# Patient Record
Sex: Male | Born: 1965 | Race: White | Hispanic: No | Marital: Single | Smoking: Current every day smoker
Health system: Southern US, Community
[De-identification: ages and names within clinical notes are randomized; demographics above are authoritative.]

---

## 1998-03-24 ENCOUNTER — Emergency Department (HOSPITAL_COMMUNITY): Admission: EM | Admit: 1998-03-24 | Discharge: 1998-03-24 | Payer: Self-pay | Admitting: Emergency Medicine

## 1998-03-24 ENCOUNTER — Encounter: Payer: Self-pay | Admitting: Emergency Medicine

## 1998-09-03 ENCOUNTER — Emergency Department (HOSPITAL_COMMUNITY): Admission: EM | Admit: 1998-09-03 | Discharge: 1998-09-03 | Payer: Self-pay | Admitting: Emergency Medicine

## 1998-09-03 ENCOUNTER — Encounter: Payer: Self-pay | Admitting: Emergency Medicine

## 2009-01-21 ENCOUNTER — Emergency Department (HOSPITAL_COMMUNITY): Admission: EM | Admit: 2009-01-21 | Discharge: 2009-01-21 | Payer: Self-pay | Admitting: Emergency Medicine

## 2009-10-06 ENCOUNTER — Emergency Department (HOSPITAL_COMMUNITY): Admission: EM | Admit: 2009-10-06 | Discharge: 2009-10-06 | Payer: Self-pay | Admitting: Emergency Medicine

## 2010-09-05 ENCOUNTER — Emergency Department (HOSPITAL_COMMUNITY)
Admission: EM | Admit: 2010-09-05 | Discharge: 2010-09-05 | Disposition: A | Discharge status: Home / Self Care | Payer: Self-pay | Attending: Emergency Medicine | Admitting: Emergency Medicine

## 2010-09-05 DIAGNOSIS — M25559 Pain in unspecified hip: Secondary | ICD-10-CM | POA: Insufficient documentation

## 2010-09-05 DIAGNOSIS — M1610 Unilateral primary osteoarthritis, unspecified hip: Secondary | ICD-10-CM | POA: Insufficient documentation

## 2010-09-05 DIAGNOSIS — M169 Osteoarthritis of hip, unspecified: Secondary | ICD-10-CM | POA: Insufficient documentation

## 2010-09-05 DIAGNOSIS — G8929 Other chronic pain: Secondary | ICD-10-CM | POA: Insufficient documentation

## 2010-11-13 ENCOUNTER — Emergency Department (HOSPITAL_COMMUNITY)
Admission: EM | Admit: 2010-11-13 | Discharge: 2010-11-13 | Disposition: A | Discharge status: Home / Self Care | Payer: Self-pay | Attending: Emergency Medicine | Admitting: Emergency Medicine

## 2010-11-13 DIAGNOSIS — M129 Arthropathy, unspecified: Secondary | ICD-10-CM | POA: Insufficient documentation

## 2010-11-13 DIAGNOSIS — M25559 Pain in unspecified hip: Secondary | ICD-10-CM | POA: Insufficient documentation

## 2013-04-04 HISTORY — PX: HIP SURGERY: SHX245

## 2016-12-26 ENCOUNTER — Emergency Department (HOSPITAL_COMMUNITY): Payer: Self-pay

## 2016-12-26 ENCOUNTER — Emergency Department (HOSPITAL_COMMUNITY)
Admission: EM | Admit: 2016-12-26 | Discharge: 2016-12-26 | Disposition: A | Discharge status: Home / Self Care | Payer: Self-pay | Attending: Emergency Medicine | Admitting: Emergency Medicine

## 2016-12-26 ENCOUNTER — Encounter (HOSPITAL_COMMUNITY): Payer: Self-pay

## 2016-12-26 DIAGNOSIS — Z96641 Presence of right artificial hip joint: Secondary | ICD-10-CM | POA: Insufficient documentation

## 2016-12-26 DIAGNOSIS — M545 Low back pain, unspecified: Secondary | ICD-10-CM

## 2016-12-26 DIAGNOSIS — F17210 Nicotine dependence, cigarettes, uncomplicated: Secondary | ICD-10-CM | POA: Insufficient documentation

## 2016-12-26 DIAGNOSIS — M5126 Other intervertebral disc displacement, lumbar region: Secondary | ICD-10-CM

## 2016-12-26 LAB — COMPREHENSIVE METABOLIC PANEL
ALBUMIN: 4 g/dL (ref 3.5–5.0)
ALK PHOS: 87 U/L (ref 38–126)
ALT: 13 U/L — AB (ref 17–63)
ANION GAP: 7 (ref 5–15)
AST: 25 U/L (ref 15–41)
BILIRUBIN TOTAL: 0.5 mg/dL (ref 0.3–1.2)
BUN: 14 mg/dL (ref 6–20)
CALCIUM: 9.6 mg/dL (ref 8.9–10.3)
CO2: 24 mmol/L (ref 22–32)
CREATININE: 0.99 mg/dL (ref 0.61–1.24)
Chloride: 107 mmol/L (ref 101–111)
GFR calc Af Amer: >60 mL/min (ref >60)
GFR calc non Af Amer: >60 mL/min (ref >60)
GLUCOSE: 103 mg/dL — AB (ref 65–99)
Potassium: 4.1 mmol/L (ref 3.5–5.1)
SODIUM: 138 mmol/L (ref 135–145)
TOTAL PROTEIN: 6.6 g/dL (ref 6.5–8.1)

## 2016-12-26 LAB — CBC WITH DIFFERENTIAL/PLATELET
BASOS PCT: 1 %
Basophils Absolute: 0.1 10*3/uL (ref 0.0–0.1)
EOS ABS: 0.4 10*3/uL (ref 0.0–0.7)
Eosinophils Relative: 4 %
HEMATOCRIT: 42.1 % (ref 39.0–52.0)
HEMOGLOBIN: 14.8 g/dL (ref 13.0–17.0)
LYMPHS ABS: 3.4 10*3/uL (ref 0.7–4.0)
Lymphocytes Relative: 41 %
MCH: 32.5 pg (ref 26.0–34.0)
MCHC: 35.2 g/dL (ref 30.0–36.0)
MCV: 92.3 fL (ref 78.0–100.0)
MONO ABS: 0.8 10*3/uL (ref 0.1–1.0)
MONOS PCT: 9 %
NEUTROS ABS: 3.8 10*3/uL (ref 1.7–7.7)
Neutrophils Relative %: 45 %
Platelets: 252 10*3/uL (ref 150–400)
RBC: 4.56 MIL/uL (ref 4.22–5.81)
RDW: 13.8 % (ref 11.5–15.5)
WBC: 8.4 10*3/uL (ref 4.0–10.5)

## 2016-12-26 LAB — URINALYSIS, ROUTINE W REFLEX MICROSCOPIC
Bilirubin Urine: NEGATIVE
Glucose, UA: NEGATIVE mg/dL
Hgb urine dipstick: NEGATIVE
Ketones, ur: NEGATIVE mg/dL
Leukocytes, UA: NEGATIVE
Nitrite: NEGATIVE
Protein, ur: NEGATIVE mg/dL
Specific Gravity, Urine: 1.01 (ref 1.005–1.030)
pH: 6 (ref 5.0–8.0)

## 2016-12-26 MED ORDER — HYDROCODONE-ACETAMINOPHEN 5-325 MG PO TABS: 1.0000 | ORAL_TABLET | ORAL | 0 refills | Status: DC | PRN

## 2016-12-26 MED ORDER — ONDANSETRON HCL 4 MG/2ML IJ SOLN
4.0000 mg | Freq: Once | INTRAMUSCULAR | Status: AC
  Administered 2016-12-26: 4 mg via INTRAVENOUS
  Filled 2016-12-26: qty 2

## 2016-12-26 MED ORDER — MORPHINE SULFATE (PF) 4 MG/ML IV SOLN
4.0000 mg | Freq: Once | INTRAVENOUS | Status: AC
  Administered 2016-12-26: 4 mg via INTRAVENOUS
  Filled 2016-12-26: qty 1

## 2016-12-26 MED ORDER — LIDOCAINE 5 % EX PTCH: 1.0000 | MEDICATED_PATCH | CUTANEOUS | 0 refills | Status: DC

## 2016-12-26 MED ORDER — METHOCARBAMOL 500 MG PO TABS: 500.0000 mg | ORAL_TABLET | Freq: Two times a day (BID) | ORAL | 0 refills | Status: DC

## 2016-12-26 MED ORDER — KETOROLAC TROMETHAMINE 30 MG/ML IJ SOLN
30.0000 mg | Freq: Once | INTRAMUSCULAR | Status: AC
  Administered 2016-12-26: 30 mg via INTRAVENOUS
  Filled 2016-12-26: qty 1

## 2016-12-26 MED ORDER — IBUPROFEN 600 MG PO TABS: 600.0000 mg | ORAL_TABLET | Freq: Four times a day (QID) | ORAL | 0 refills | Status: DC | PRN

## 2016-12-26 MED ORDER — DEXAMETHASONE SODIUM PHOSPHATE 10 MG/ML IJ SOLN
10.0000 mg | Freq: Once | INTRAMUSCULAR | Status: AC
  Administered 2016-12-26: 10 mg via INTRAVENOUS
  Filled 2016-12-26: qty 1

## 2016-12-26 NOTE — ED Provider Notes (Signed)
[facility]-EMERGENCY DEPT Provider Note   CSN: [id number] Arrival date & time: 12/26/16  1305     History   Chief Complaint Chief Complaint  Patient presents with  . Back Pain    HPI Elijah Lester is a 51 y.o. male.  HPI   Elijah Lester is a 51 y.o. male, with a history of Scoliosis and right total hip replacement, presenting to the ED with Lower back pain beginning yesterday. Pain arose at the end of the day. Pain is severe, constant, 10/10, shooting, radiating around to the front of the hips and down the front of the legs bilaterally. Patient also endorses weakness at the hips as well as numbness in the bilateral legs. He has been taking ibuprofen without improvement. Patient has chronic back pain due to scoliosis, however, has never had pain like this and has never had weakness or numbness.  Patient also complains of lymphadenopathy noted under the right arm and on the right side of the neck for the last 6 months. Areas are tender to the touch.  Denies fever/chills, N/V/D, changes in bowel or bladder function, abdominal pain, falls/trauma, or any other complaints.    History reviewed. No pertinent past medical history.  There are no active problems to display for this patient.   Past Surgical History:  Procedure Laterality Date  . HIP SURGERY Right 2015       Home Medications    Prior to Admission medications   Medication Sig Start Date End Date Taking? Authorizing Provider  ibuprofen (ADVIL,MOTRIN) 800 MG tablet Take 800 mg by mouth every 8 (eight) hours as needed (for hip pain).    Yes [provider]  naproxen sodium (ANAPROX) 220 MG tablet Take 220-440 mg by mouth 2 (two) times daily as needed (for hip pain).    Yes [provider]  HYDROcodone-acetaminophen (NORCO/VICODIN) 5-325 MG tablet Take 1-2 tablets by mouth every 4 (four) hours as needed for severe pain. 12/26/16   Dynesha Woolen C, PA-C  ibuprofen (ADVIL,MOTRIN) 600 MG tablet Take 1 tablet (600  mg total) by mouth every 6 (six) hours as needed. 12/26/16   Jemar Paulsen C, PA-C  lidocaine (LIDODERM) 5 % Place 1 patch onto the skin daily. Remove & Discard patch within 12 hours or as directed by MD 12/26/16   Venna Berberich C, PA-C  methocarbamol (ROBAXIN) 500 MG tablet Take 1 tablet (500 mg total) by mouth 2 (two) times daily. 12/26/16   Tabatha Razzano, Hillard Danker, PA-C    Family History No family history on file.  Social History Social History  Substance Use Topics  . Smoking status: Current Every Day Smoker    Types: Cigarettes  . Smokeless tobacco: Never Used  . Alcohol use No     Allergies   Patient has no known allergies.   Review of Systems Review of Systems  Constitutional: Negative for chills, diaphoresis and fever.  Respiratory: Negative for shortness of breath.   Cardiovascular: Negative for chest pain.  Gastrointestinal: Negative for abdominal pain, constipation, diarrhea, nausea and vomiting.  Genitourinary: Negative for difficulty urinating.  Musculoskeletal: Positive for back pain.  Neurological: Positive for weakness and numbness. Negative for dizziness, light-headedness and headaches.  Hematological: Positive for adenopathy.  All other systems reviewed and are negative.    Physical Exam Updated Vital Signs BP (!) 148/89 (BP Location: Left Arm)   Pulse 60   Temp 98.9 F (37.2 C) (Oral)   Resp 14   Ht  (1.803 m)  Wt 68 kg (150 lb)   SpO2 99%   BMI 20.92 kg/m   Physical Exam  Constitutional: He appears well-developed and well-nourished. No distress.  HENT:  Head: Normocephalic and atraumatic.  Eyes: Conjunctivae are normal.  Neck: Neck supple.  Cardiovascular: Normal rate, regular rhythm, normal heart sounds and intact distal pulses.   Pulmonary/Chest: Effort normal and breath sounds normal. No respiratory distress.  Abdominal: Soft. There is no tenderness. There is no guarding.  Genitourinary:  Genitourinary Comments: Rectal tone appears to be normal.  Perianal sensation intact. RN served as Biomedical engineer.  Musculoskeletal: He exhibits no edema.       Lumbar back: He exhibits tenderness.       Back:  Full passive range of motion in the major joints of the lower extremities bilaterally.  Lymphadenopathy:       Head (right side): Tonsillar adenopathy present.    He has no cervical adenopathy.    He has axillary adenopathy.       Right axillary: Lateral adenopathy present.  Neurological: He is alert.  No noted sensory deficits. No noted saddle anesthesias. 2/5 strength with hip flexion bilaterally. 4/5 strength with flexion and extension at the knees bilaterally. 5/5 bilateral plantar and dorsiflexion. Normal patellar and Achilles reflexes.   Skin: Skin is warm and dry. He is not diaphoretic.  Psychiatric: He has a normal mood and affect. His behavior is normal.  Nursing note and vitals reviewed.    ED Treatments / Results  Labs (all labs ordered are listed, but only abnormal results are displayed) Labs Reviewed  URINALYSIS, ROUTINE W REFLEX MICROSCOPIC - Abnormal; Notable for the following:       Result Value   Color, Urine STRAW (*)    All other components within normal limits  COMPREHENSIVE METABOLIC PANEL - Abnormal; Notable for the following:    Glucose, Bld 103 (*)    ALT 13 (*)    All other components within normal limits  CBC WITH DIFFERENTIAL/PLATELET    EKG  EKG Interpretation None       Radiology Mr Lumbar Spine Wo Contrast  Result Date: 12/26/2016 CLINICAL DATA:  Initial evaluation for increase low back pain with intermittent leg numbness. EXAM: MRI LUMBAR SPINE WITHOUT CONTRAST TECHNIQUE: Multiplanar, multisequence MR imaging of the lumbar spine was performed. No intravenous contrast was administered. COMPARISON:  None. FINDINGS: Segmentation: Normal segmentation. Lowest well-formed disc labeled the L5-S1 level. Alignment: Straightening with slight reversal of the normal lumbar lordosis. Trace retrolisthesis of  L2 on L3, L3 on L4, and L4 on L5. Mild dextroscoliosis. Vertebrae: Vertebral body heights maintained. No evidence for acute or chronic fracture. Prominent reactive endplate changes present about the L2-3 interspace. More mild reactive endplate changes noted at L3-4 and L5-S1 as well. No discrete or worrisome osseous lesions. Conus medullaris: Extends to the L1-2 level and appears normal. Paraspinal and other soft tissues: Paraspinous soft tissues within normal limits. Visualized visceral structures are unremarkable. Disc levels: L1-2: Mild degenerative disc bulge flattens the ventral thecal sac. No significant stenosis. L2-3: Trace retrolisthesis. Diffuse disc bulge with degenerative intervertebral disc space narrowing and disc desiccation. Mild facet and ligamentum flavum hypertrophy. No significant canal stenosis. Mild left L2 foraminal narrowing. L3-4: Trace retrolisthesis. Diffuse disc bulge with disc desiccation and intervertebral disc space narrowing. Disc bulging eccentric to the left, closely approximating the exiting left L3 nerve root without frank impingement (series 9, image 22). Mild facet and ligamentum flavum hypertrophy. No significant canal stenosis. Mild-to-moderate left with mild  right foraminal stenosis. L4-5: Diffuse disc bulge with disc desiccation and intervertebral disc space narrowing. Superimposed 6 mm central disc protrusion indents the ventral thecal sac. Protruding disc closely approximates the descending L5 nerve roots without frank impingement. Mild facet and ligamentum flavum hypertrophy. Resultant mild spinal stenosis. Mild to moderate bilateral L4 foraminal stenosis. L5-S1: Diffuse disc bulge with disc desiccation and intervertebral disc space narrowing. Mild reactive endplate changes. Superimposed broad central/ right subarticular disc protrusion (series 9, image 35). Protruding disc encroaches upon the right lateral recess, abutting the descending right S1 nerve root. Mild facet  hypertrophy. No significant spinal stenosis. Mild to moderate bilateral L5 foraminal stenosis, right worse than left. IMPRESSION: 1. No acute abnormality within the lumbar spine. 2. Central/right subarticular disc protrusion at L5-S1, contacting the descending right S1 nerve root in the right lateral recess. 3. Central disc protrusion at L4-5 with resultant mild canal stenosis without frank neural impingement. 4. Multifactorial degenerative changes with resultant mild to moderate foraminal narrowing on the left at L3-4, and bilaterally at L4-5 and L5-S1. Electronically Signed   By: Rise Mu M.D.   On: 12/26/2016 22:31    Procedures Procedures (including critical care time)  Medications Ordered in ED Medications  dexamethasone (DECADRON) injection 10 mg (not administered)  morphine 4 MG/ML injection 4 mg (not administered)  ketorolac (TORADOL) 30 MG/ML injection 30 mg (30 mg Intravenous Given 12/26/16 2037)  morphine 4 MG/ML injection 4 mg (4 mg Intravenous Given 12/26/16 2129)  ondansetron (ZOFRAN) injection 4 mg (4 mg Intravenous Given 12/26/16 2130)     Initial Impression / Assessment and Plan / ED Course  I have reviewed the triage vital signs and the nursing notes.  Pertinent labs & imaging results that were available during my care of the patient were reviewed by me and considered in my medical decision making (see chart for details).  Clinical Course as of Dec 26 2309  Mon Dec 26, 2016  2110 Patient states his pain is now about 8/10, slightly improved with the Toradol.  [SJ]  2238 Pain is 5/10. Patient's strength retested. 4/5 with flexion at the hips bilaterally. 5/5 in the knees and ankles.   [SJ]  2250 Discussed lab and imaging results with the patient as well as follow-up plan. Patient ambulated without assistance, but with antalgic gait.  [SJ]    Clinical Course User Index [SJ] Lowen Barringer C, PA-C    Patient presents with acute back pain. Weakness on initial exam.  MRI obtained. Central disc protrusion without neural impingement. Weakness improved with pain management. Patient ambulated without assistance. Neurosurgery follow-up. The patient was given instructions for home care as well as return precautions. Patient voices understanding of these instructions, accepts the plan, and is comfortable with discharge.   Findings and plan of care discussed with Benjiman Core, MD.    Final Clinical Impressions(s) / ED Diagnoses   Final diagnoses:  Acute midline low back pain without sciatica  Protrusion of lumbar intervertebral disc    New Prescriptions New Prescriptions   HYDROCODONE-ACETAMINOPHEN (NORCO/VICODIN) 5-325 MG TABLET    Take 1-2 tablets by mouth every 4 (four) hours as needed for severe pain.   IBUPROFEN (ADVIL,MOTRIN) 600 MG TABLET    Take 1 tablet (600 mg total) by mouth every 6 (six) hours as needed.   LIDOCAINE (LIDODERM) 5 %    Place 1 patch onto the skin daily. Remove & Discard patch within 12 hours or as directed by MD   METHOCARBAMOL (ROBAXIN) 500 MG  TABLET    Take 1 tablet (500 mg total) by mouth 2 (two) times daily.  Concepcion Living, PA-C 12/26/16 2311    Benjiman Core, MD 12/27/16 0010

## 2016-12-26 NOTE — ED Triage Notes (Signed)
Pt. Has a hx. Of lower back pain, denies any injury.  This is a pain that he has had, but not followed with a MD.  In the last few days the pain has increased and he is having numbness to his legs and his intermittent numbness to his rt. Arm and shoulder.  He denies any  Urinary or bowel problems.  He does report that he has swollen lymph nodes to bother underarms and bilateral neck. Painful to touch on assessment.  denies any sore throat no bleeding noted.  Denies any fevers.  Skin is pink warm and dry.  Alert and oriented X 4.  Pt. Is having difficulty sitting must stand for relief

## 2016-12-26 NOTE — Discharge Instructions (Signed)
There were noted disc protrusions on MRI. This is likely causing her pain. Expect your soreness to increase over the next 2-3 days. Take it easy, but do not lay around too much as this may make any stiffness worse.  Antiinflammatory medications: Take 600 mg of ibuprofen every 6 hours or 440 mg (over the counter dose) to 500 mg (prescription dose) of naproxen every 12 hours for the next 3 days. After this time, these medications may be used as needed for pain. Take these medications with food to avoid upset stomach. Choose only one of these medications, do not take them together.  Tylenol: Should you continue to have additional pain while taking the ibuprofen or naproxen, you may add in tylenol as needed. Your daily total maximum amount of tylenol from all sources should be limited to /day for persons without liver problems, or /day for those with liver problems. Vicodin: May take the Vicodin as needed for severe pain. Do not drive or perform other dangerous activities while taking the Vicodin. Muscle relaxer: Robaxin is a muscle relaxer and may help loosen stiff and/or spasming muscles. Do not take the Robaxin while driving or performing other dangerous activities.  Lidocaine patches: These are available via either prescription or over-the-counter. The over-the-counter option may be more economical one and are likely just as effective. There are multiple over-the-counter brands, such as Salonpas. Exercises: Be sure to perform the attached exercises starting with three times a week and working up to performing them daily. This is an essential part of preventing [place] term problems.   Follow up with the neurosurgeon for further assessment and management of this issue.   There were no acute abnormalities noted on the lab work. Follow-up with a primary care provider for continued assessment for the lymph node abnormalities.

## 2016-12-26 NOTE — ED Notes (Signed)
Patient transported to MRI 

## 2019-03-12 ENCOUNTER — Other Ambulatory Visit: Payer: Self-pay

## 2019-03-12 ENCOUNTER — Encounter (HOSPITAL_COMMUNITY): Payer: Self-pay | Admitting: Emergency Medicine

## 2019-03-12 ENCOUNTER — Emergency Department (HOSPITAL_COMMUNITY): Payer: Self-pay

## 2019-03-12 ENCOUNTER — Inpatient Hospital Stay (HOSPITAL_COMMUNITY)
Admission: EM | Discharge status: Against Medical Advice | Payer: Self-pay | Source: Home / Self Care | Attending: Cardiology

## 2019-03-12 ENCOUNTER — Inpatient Hospital Stay (HOSPITAL_COMMUNITY)
Admission: EM | Admit: 2019-03-12 | Discharge: 2019-03-12 | DRG: 287 | Discharge status: Against Medical Advice | Payer: Self-pay | Attending: Cardiology | Admitting: Cardiology

## 2019-03-12 DIAGNOSIS — Z8249 Family history of ischemic heart disease and other diseases of the circulatory system: Secondary | ICD-10-CM

## 2019-03-12 DIAGNOSIS — Z79891 Long term (current) use of opiate analgesic: Secondary | ICD-10-CM

## 2019-03-12 DIAGNOSIS — Z5329 Procedure and treatment not carried out because of patient's decision for other reasons: Secondary | ICD-10-CM | POA: Diagnosis not present

## 2019-03-12 DIAGNOSIS — Z20828 Contact with and (suspected) exposure to other viral communicable diseases: Secondary | ICD-10-CM | POA: Diagnosis present

## 2019-03-12 DIAGNOSIS — Z720 Tobacco use: Secondary | ICD-10-CM | POA: Diagnosis present

## 2019-03-12 DIAGNOSIS — I309 Acute pericarditis, unspecified: Principal | ICD-10-CM | POA: Diagnosis present

## 2019-03-12 DIAGNOSIS — I25110 Atherosclerotic heart disease of native coronary artery with unstable angina pectoris: Secondary | ICD-10-CM | POA: Diagnosis present

## 2019-03-12 DIAGNOSIS — I200 Unstable angina: Secondary | ICD-10-CM | POA: Diagnosis present

## 2019-03-12 DIAGNOSIS — Z79899 Other long term (current) drug therapy: Secondary | ICD-10-CM

## 2019-03-12 DIAGNOSIS — I213 ST elevation (STEMI) myocardial infarction of unspecified site: Secondary | ICD-10-CM

## 2019-03-12 DIAGNOSIS — Z791 Long term (current) use of non-steroidal anti-inflammatories (NSAID): Secondary | ICD-10-CM

## 2019-03-12 DIAGNOSIS — R072 Precordial pain: Secondary | ICD-10-CM

## 2019-03-12 DIAGNOSIS — F17210 Nicotine dependence, cigarettes, uncomplicated: Secondary | ICD-10-CM | POA: Diagnosis present

## 2019-03-12 HISTORY — PX: LEFT HEART CATH AND CORONARY ANGIOGRAPHY: CATH118249

## 2019-03-12 LAB — BASIC METABOLIC PANEL
Anion gap: 11 (ref 5–15)
BUN: 10 mg/dL (ref 6–20)
CO2: 20 mmol/L — ABNORMAL LOW (ref 22–32)
Calcium: 9.6 mg/dL (ref 8.9–10.3)
Chloride: 104 mmol/L (ref 98–111)
Creatinine, Ser: 1.04 mg/dL (ref 0.61–1.24)
GFR calc Af Amer: >60 mL/min (ref >60)
GFR calc non Af Amer: >60 mL/min (ref >60)
Glucose, Bld: 84 mg/dL (ref 70–99)
Potassium: 4.3 mmol/L (ref 3.5–5.1)
Sodium: 135 mmol/L (ref 135–145)

## 2019-03-12 LAB — LIPID PANEL
Cholesterol: 219 mg/dL — ABNORMAL HIGH (ref 0–200)
HDL: 54 mg/dL (ref >40)
LDL Cholesterol: 150 mg/dL — ABNORMAL HIGH (ref 0–99)
Total CHOL/HDL Ratio: 4.1 RATIO
Triglycerides: 73 mg/dL (ref <150)
VLDL: 15 mg/dL (ref 0–40)

## 2019-03-12 LAB — TROPONIN I (HIGH SENSITIVITY)
Troponin I (High Sensitivity): 10 ng/L (ref <18)
Troponin I (High Sensitivity): 7 ng/L (ref <18)

## 2019-03-12 LAB — POCT I-STAT, CHEM 8
BUN: 10 mg/dL (ref 6–20)
Calcium, Ion: 1.22 mmol/L (ref 1.15–1.40)
Chloride: 104 mmol/L (ref 98–111)
Creatinine, Ser: 0.8 mg/dL (ref 0.61–1.24)
Glucose, Bld: 87 mg/dL (ref 70–99)
HCT: 44 % (ref 39.0–52.0)
Hemoglobin: 15 g/dL (ref 13.0–17.0)
Potassium: 4.1 mmol/L (ref 3.5–5.1)
Sodium: 138 mmol/L (ref 135–145)
TCO2: 23 mmol/L (ref 22–32)

## 2019-03-12 LAB — RESPIRATORY PANEL BY RT PCR (FLU A&B, COVID)
Influenza A by PCR: NEGATIVE
Influenza B by PCR: NEGATIVE
SARS Coronavirus 2 by RT PCR: NEGATIVE

## 2019-03-12 LAB — CBC
HCT: 46.7 % (ref 39.0–52.0)
Hemoglobin: 15.6 g/dL (ref 13.0–17.0)
MCH: 32 pg (ref 26.0–34.0)
MCHC: 33.4 g/dL (ref 30.0–36.0)
MCV: 95.9 fL (ref 80.0–100.0)
Platelets: 310 10*3/uL (ref 150–400)
RBC: 4.87 MIL/uL (ref 4.22–5.81)
RDW: 13 % (ref 11.5–15.5)
WBC: 12.3 10*3/uL — ABNORMAL HIGH (ref 4.0–10.5)
nRBC: 0 % (ref 0.0–0.2)

## 2019-03-12 LAB — POC SARS CORONAVIRUS 2 AG: SARS Coronavirus 2 Ag: NEGATIVE

## 2019-03-12 LAB — HEMOGLOBIN A1C
Hgb A1c MFr Bld: 5.4 % (ref 4.8–5.6)
Mean Plasma Glucose: 108.28 mg/dL

## 2019-03-12 SURGERY — LEFT HEART CATH AND CORONARY ANGIOGRAPHY
Anesthesia: LOCAL

## 2019-03-12 MED ORDER — VERAPAMIL HCL 2.5 MG/ML IV SOLN
INTRAVENOUS | Status: DC | PRN
  Administered 2019-03-12: 10 mL via INTRA_ARTERIAL

## 2019-03-12 MED ORDER — LIDOCAINE HCL (PF) 1 % IJ SOLN
INTRAMUSCULAR | Status: AC
  Filled 2019-03-12: qty 30

## 2019-03-12 MED ORDER — SODIUM CHLORIDE 0.9% FLUSH: 3.0000 mL | Freq: Two times a day (BID) | INTRAVENOUS | Status: DC

## 2019-03-12 MED ORDER — ASPIRIN EC 81 MG PO TBEC: 81.0000 mg | DELAYED_RELEASE_TABLET | Freq: Every day | ORAL | Status: DC

## 2019-03-12 MED ORDER — SODIUM CHLORIDE 0.9 % IV SOLN: 250.0000 mL | INTRAVENOUS | Status: DC | PRN

## 2019-03-12 MED ORDER — HEPARIN (PORCINE) IN NACL 1000-0.9 UT/500ML-% IV SOLN
INTRAVENOUS | Status: DC | PRN
  Administered 2019-03-12 (×3): 500 mL

## 2019-03-12 MED ORDER — ACETAMINOPHEN 325 MG PO TABS: 650.0000 mg | ORAL_TABLET | ORAL | Status: DC | PRN

## 2019-03-12 MED ORDER — IBUPROFEN 800 MG PO TABS
800.0000 mg | ORAL_TABLET | Freq: Four times a day (QID) | ORAL | Status: DC | PRN
  Administered 2019-03-12: 800 mg via ORAL
  Filled 2019-03-12: qty 1

## 2019-03-12 MED ORDER — HEPARIN (PORCINE) IN NACL 1000-0.9 UT/500ML-% IV SOLN
INTRAVENOUS | Status: AC
  Filled 2019-03-12: qty 1000

## 2019-03-12 MED ORDER — ONDANSETRON HCL 4 MG/2ML IJ SOLN: 4.0000 mg | Freq: Four times a day (QID) | INTRAMUSCULAR | Status: DC | PRN

## 2019-03-12 MED ORDER — ASPIRIN 81 MG PO CHEW
CHEWABLE_TABLET | ORAL | Status: AC
  Administered 2019-03-12: 14:00:00 324 mg via ORAL
  Filled 2019-03-12: qty 4

## 2019-03-12 MED ORDER — NITROGLYCERIN 1 MG/10 ML FOR IR/CATH LAB
INTRA_ARTERIAL | Status: AC
  Filled 2019-03-12: qty 10

## 2019-03-12 MED ORDER — HEPARIN SODIUM (PORCINE) 5000 UNIT/ML IJ SOLN
4000.0000 [IU] | Freq: Once | INTRAMUSCULAR | Status: AC
  Administered 2019-03-12: 14:00:00 4000 [IU] via INTRAVENOUS

## 2019-03-12 MED ORDER — LIDOCAINE HCL (PF) 1 % IJ SOLN
INTRAMUSCULAR | Status: DC | PRN
  Administered 2019-03-12: 2 mL via INTRADERMAL

## 2019-03-12 MED ORDER — SODIUM CHLORIDE 0.9% FLUSH: 3.0000 mL | INTRAVENOUS | Status: DC | PRN

## 2019-03-12 MED ORDER — IBUPROFEN 400 MG PO TABS: 800.0000 mg | ORAL_TABLET | Freq: Three times a day (TID) | ORAL | 0 refills | Status: AC

## 2019-03-12 MED ORDER — ASPIRIN 81 MG PO CHEW
324.0000 mg | CHEWABLE_TABLET | Freq: Once | ORAL | Status: AC
  Administered 2019-03-12: 14:00:00 324 mg via ORAL

## 2019-03-12 MED ORDER — HEPARIN (PORCINE) IN NACL 1000-0.9 UT/500ML-% IV SOLN
INTRAVENOUS | Status: AC
  Filled 2019-03-12: qty 500

## 2019-03-12 MED ORDER — NICOTINE 21 MG/24HR TD PT24
21.0000 mg | MEDICATED_PATCH | Freq: Every day | TRANSDERMAL | Status: DC
  Administered 2019-03-12: 21 mg via TRANSDERMAL
  Filled 2019-03-12: qty 1

## 2019-03-12 MED ORDER — COLCHICINE 0.6 MG PO TABS
0.6000 mg | ORAL_TABLET | Freq: Every day | ORAL | Status: DC
  Administered 2019-03-12: 0.6 mg via ORAL
  Filled 2019-03-12: qty 1

## 2019-03-12 MED ORDER — NITROGLYCERIN 0.4 MG SL SUBL: 0.4000 mg | SUBLINGUAL_TABLET | SUBLINGUAL | Status: DC | PRN

## 2019-03-12 MED ORDER — VERAPAMIL HCL 2.5 MG/ML IV SOLN
INTRAVENOUS | Status: AC
  Filled 2019-03-12: qty 2

## 2019-03-12 MED ORDER — HEPARIN SODIUM (PORCINE) 5000 UNIT/ML IJ SOLN: 5000.0000 [IU] | Freq: Three times a day (TID) | INTRAMUSCULAR | Status: DC

## 2019-03-12 MED ORDER — COLCHICINE 0.6 MG PO TABS: 0.6000 mg | ORAL_TABLET | Freq: Every day | ORAL | 3 refills | Status: DC

## 2019-03-12 MED ORDER — IOHEXOL 350 MG/ML SOLN
INTRAVENOUS | Status: DC | PRN
  Administered 2019-03-12: 85 mL via INTRACARDIAC

## 2019-03-12 MED ORDER — PANTOPRAZOLE SODIUM 40 MG PO TBEC
40.0000 mg | DELAYED_RELEASE_TABLET | Freq: Every day | ORAL | Status: DC
  Filled 2019-03-12: qty 1

## 2019-03-12 MED ORDER — SODIUM CHLORIDE 0.9 % WEIGHT BASED INFUSION: 1.0000 mL/kg/h | INTRAVENOUS | Status: DC

## 2019-03-12 MED ORDER — PANTOPRAZOLE SODIUM 40 MG PO TBEC: 40.0000 mg | DELAYED_RELEASE_TABLET | Freq: Every day | ORAL | 3 refills | Status: DC

## 2019-03-12 SURGICAL SUPPLY — 13 items
CATH 5FR JL3.5 JR4 ANG PIG MP (CATHETERS) ×1 IMPLANT
CATH VISTA GUIDE 6FR XBLAD3.5 (CATHETERS) ×1 IMPLANT
DEVICE RAD COMP TR BAND LRG (VASCULAR PRODUCTS) ×1 IMPLANT
GLIDESHEATH SLEND SS 6F .021 (SHEATH) ×1 IMPLANT
GUIDEWIRE INQWIRE 1.5J.035X260 (WIRE) IMPLANT
INQWIRE 1.5J .035X260CM (WIRE) ×2
KIT ENCORE 26 ADVANTAGE (KITS) IMPLANT
KIT HEART LEFT (KITS) ×2 IMPLANT
PACK CARDIAC CATHETERIZATION (CUSTOM PROCEDURE TRAY) ×2 IMPLANT
SYR MEDRAD MARK 7 150ML (SYRINGE) ×2 IMPLANT
TRANSDUCER W/STOPCOCK (MISCELLANEOUS) ×2 IMPLANT
TUBING CIL FLEX 10 FLL-RA (TUBING) ×2 IMPLANT
WIRE ASAHI PROWATER 180CM (WIRE) IMPLANT

## 2019-03-12 NOTE — Discharge Instructions (Signed)

## 2019-03-12 NOTE — Discharge Summary (Signed)
Discharge Summary    Patient ID: Elijah Lester MRN: [id number]; DOB: 06-Oct-1965  Admit date: 03/12/2019 Discharge date: 03/12/2019  Primary Care Provider: System, Pcp Not In  Primary Cardiologist: Peter SwazilandJordan, MD  Primary Electrophysiologist:  None   Discharge Diagnoses    Principal Problem:   Acute pericarditis Active Problems:   Tobacco abuse   Unstable angina (HCC)   Precordial pain   Allergies No Known Allergies  Diagnostic Studies/Procedures    CARDIAC CATH: 03/12/2019  Prox RCA to Mid RCA lesion is 20% stenosed.  1st Diag lesion is 35% stenosed.  Ost Cx to Prox Cx lesion is 30% stenosed.  The left ventricular systolic function is normal.  LV end diastolic pressure is normal.  The left ventricular ejection fraction is 55-65% by visual estimate.  There is no mitral valve regurgitation.   1. Mild nonobstructive CAD 2. Normal LV function 3. Normal LVEDP  Plan: I suspect his pain and Ecg changes are related to acute pericarditis. Will cycle Ecg and enzymes. Check inflammatory markers. Check Echo. Treat with colchicine and ibuprofen. Monitor on telemetry. _____________   History of Present Illness     Elijah Lester is a 53 y.o. male with history of tobacco use and strong family history of coronary artery disease developed chest pain came to the hospital where he had ST elevation in multiple leads.  Hospital Course     Consultants: None  Because of his ongoing chest pain and ECG abnormalities, he was taken emergently to the Cath Lab.  Cardiac catheterization results are above.  He had minimal coronary artery disease and the ECG changes were felt secondary to pericarditis.  He was started on treatment for pericarditis with colchicine and ibuprofen 800 mg, receiving 1 dose of each.  On the early evening of 12/8, the patient got the nursing staff to contact the admitting team.  He stated he was not staying and would leave AMA if not discharged.  Patient  stated that this was because he was tired of being stuck and tired of being in the hospital.  I spoke to his wife and to the patient, the wife stated that she was very sure he would not change his mind and the patient stated this as well.  I reviewed the case with Dr. Rennis GoldenHilty.  Dr. Rennis GoldenHilty stated that if the patient understood to return for any recurrence or change in symptoms, or if he would keep follow-up appointments for an echocardiogram and follow-up with provider, he could be discharged home with ibuprofen and colchicine.  In speaking with the wife, the patient does not have insurance and may not be able to afford the colchicine.  I explained that we might be able to get assistance for that if he was staying till tomorrow, but he still said no.  He was advised that he needed to stay until his TR band comes off.  If his TR band comes off and the site is stable, he can be discharged home, to follow-up as an outpatient.  With an echocardiogram and a visit.  Did the patient have an acute coronary syndrome (MI, NSTEMI, STEMI, etc) this admission?:  No                               Did the patient have a percutaneous coronary intervention (stent / angioplasty)?:  No.   _____________  Discharge Vitals Blood pressure 113/79, pulse 69, temperature 98 F (36.7  C), temperature source Oral, resp. rate 19, height 5\' 11"  (1.803 m), weight 72.2 kg, SpO2 91 %.  Filed Weights   03/12/19 1258 03/12/19 1514  Weight: 75.8 kg 72.2 kg    Labs & Radiologic Studies    CBC Recent Labs    03/12/19 1355 03/12/19 1356  WBC 12.3*  --   HGB 15.6 15.0  HCT 46.7 44.0  MCV 95.9  --   PLT 310  --    Basic Metabolic Panel Recent Labs    14/08/20 1355 03/12/19 1356  NA 135 138  K 4.3 4.1  CL 104 104  CO2 20*  --   GLUCOSE 84 87  BUN 10 10  CREATININE 1.04 0.80  CALCIUM 9.6  --    Liver Function Tests No results for input(s): AST, ALT, ALKPHOS, BILITOT, PROT, ALBUMIN in the last 72 hours. No results  for input(s): LIPASE, AMYLASE in the last 72 hours. High Sensitivity Troponin:   Recent Labs  Lab 03/12/19 1355 03/12/19 1516  TROPONINIHS 10 7    BNP Invalid input(s): POCBNP D-Dimer No results for input(s): DDIMER in the last 72 hours. Hemoglobin A1C Recent Labs    03/12/19 1356  HGBA1C 5.4   Fasting Lipid Panel Recent Labs    03/12/19 1355  CHOL 219*  HDL 54  LDLCALC 150*  TRIG 73  CHOLHDL 4.1   Thyroid Function Tests No results for input(s): TSH, T4TOTAL, T3FREE, THYROIDAB in the last 72 hours.  Invalid input(s): FREET3 _____________  Dg Chest 2 View  Result Date: 03/12/2019 CLINICAL DATA:  Chest pain and shortness of breath. EXAM: CHEST - 2 VIEW COMPARISON:  None. FINDINGS: The heart size and mediastinal contours are within normal limits. Both lungs are clear. The visualized skeletal structures are unremarkable. IMPRESSION: No active cardiopulmonary disease. Electronically Signed   By: 14/11/2018 III M.D   On: 03/12/2019 13:34   Disposition   Pt is being discharged home today in good condition.  Follow-up Plans & Appointments    Follow-up Information    14/11/2018, Peter M, MD Follow up.   Specialty: Cardiology Why: Echocardiogram and follow-up visit, the office will call Contact information: [address] [place] [phone]          Discharge Instructions    Diet - low sodium heart healthy   Complete by: As directed    Increase activity slowly   Complete by: As directed       Discharge Medications   Allergies as of 03/12/2019   No Known Allergies     Medication List    STOP taking these medications   naproxen sodium 220 MG tablet Commonly known as: ALEVE     TAKE these medications   colchicine 0.6 MG tablet Take 1 tablet (0.6 mg total) by mouth daily. Start taking on: March 13, 2019   HYDROcodone-acetaminophen 5-325 MG tablet Commonly known as: NORCO/VICODIN Take 1-2 tablets by mouth every 4  (four) hours as needed for severe pain.   ibuprofen 400 MG tablet Commonly known as: ADVIL Take 2 tablets (800 mg total) by mouth every 8 (eight) hours. What changed:   medication strength  how much to take  when to take this  reasons to take this  Another medication with the same name was removed. Continue taking this medication, and follow the directions you see here.   lidocaine 5 % Commonly known as: Lidoderm Place 1 patch onto the skin daily. Remove & Discard patch within  12 hours or as directed by MD   methocarbamol 500 MG tablet Commonly known as: ROBAXIN Take 1 tablet (500 mg total) by mouth 2 (two) times daily.   pantoprazole 40 MG tablet Commonly known as: PROTONIX Take 1 tablet (40 mg total) by mouth daily. Start taking on: March 13, 2019          Outstanding Labs/Studies   Echocardiogram  Duration of Discharge Encounter   Greater than 30 minutes including physician time.  Signed, Rosaria Ferries, PA-C 03/12/2019, 6:30 PM

## 2019-03-12 NOTE — ED Triage Notes (Signed)
Pt reports chest pressure to left side to left arm starting last night. Endorses SOB and 1 episode of emesis.

## 2019-03-12 NOTE — ED Provider Notes (Signed)
[facility] EMERGENCY DEPARTMENT Provider Note   CSN: [id number] Arrival date & time: 03/12/19  1252     History   Chief Complaint Chief Complaint  Patient presents with  . Chest Pain    HPI Elijah Lester is a 53 y.o. male.     The history is provided by the patient and medical records. No language interpreter was used.  Chest Pain Pain location:  L chest Pain quality: crushing and pressure   Pain radiates to:  L shoulder and L arm Pain severity:  Severe Onset quality:  Gradual Duration:  1 day Timing:  Intermittent Progression:  Improving Chronicity:  New Relieved by:  Nitroglycerin Worsened by:  Nothing Ineffective treatments:  None tried Associated symptoms: nausea, shortness of breath and vomiting   Associated symptoms: no abdominal pain, no altered mental status, no anxiety, no back pain, no cough, no diaphoresis, no dizziness, no fatigue, no fever, no headache, no palpitations and no syncope   Risk factors: male sex and smoking   Risk factors: no prior DVT/PE     History reviewed. No pertinent past medical history.  There are no active problems to display for this patient.   Past Surgical History:  Procedure Laterality Date  . HIP SURGERY Right 2015        Home Medications    Prior to Admission medications   Medication Sig Start Date End Date Taking? Authorizing Provider  HYDROcodone-acetaminophen (NORCO/VICODIN) 5-325 MG tablet Take 1-2 tablets by mouth every 4 (four) hours as needed for severe pain. 12/26/16   Joy, Shawn C, PA-C  ibuprofen (ADVIL,MOTRIN) 600 MG tablet Take 1 tablet (600 mg total) by mouth every 6 (six) hours as needed. 12/26/16   Joy, Shawn C, PA-C  ibuprofen (ADVIL,MOTRIN) 800 MG tablet Take 800 mg by mouth every 8 (eight) hours as needed (for hip pain).     [provider]  lidocaine (LIDODERM) 5 % Place 1 patch onto the skin daily. Remove & Discard patch within 12 hours or as directed by MD 12/26/16   Joy,  Shawn C, PA-C  methocarbamol (ROBAXIN) 500 MG tablet Take 1 tablet (500 mg total) by mouth 2 (two) times daily. 12/26/16   Joy, Shawn C, PA-C  naproxen sodium (ANAPROX) 220 MG tablet Take 220-440 mg by mouth 2 (two) times daily as needed (for hip pain).     [provider]    Family History No family history on file.  Social History Social History   Tobacco Use  . Smoking status: Current Every Day Smoker    Types: Cigarettes  . Smokeless tobacco: Never Used  Substance Use Topics  . Alcohol use: No  . Drug use: No     Allergies   Patient has no known allergies.   Review of Systems Review of Systems  Constitutional: Positive for chills. Negative for diaphoresis, fatigue and fever.  HENT: Negative for congestion.   Eyes: Negative for visual disturbance.  Respiratory: Positive for shortness of breath. Negative for cough, chest tightness, wheezing and stridor.   Cardiovascular: Positive for chest pain. Negative for palpitations, leg swelling and syncope.  Gastrointestinal: Positive for nausea and vomiting. Negative for abdominal pain, constipation and diarrhea.  Genitourinary: Negative for flank pain.  Musculoskeletal: Negative for back pain, neck pain and neck stiffness.  Skin: Negative for rash and wound.  Neurological: Negative for dizziness, light-headedness and headaches.  Psychiatric/Behavioral: Negative for agitation.  All other systems reviewed and are negative.    Physical  Exam Updated Vital Signs BP 128/76   Pulse 94   Temp 98.1 F (36.7 C)   Resp 18   Ht 5\' 11"  (1.803 m)   Wt 75.8 kg   SpO2 98%   BMI 23.29 kg/m   Physical Exam Constitutional:      General: He is not in acute distress.    Appearance: He is well-developed. He is not ill-appearing, toxic-appearing or diaphoretic.  HENT:     Head: Normocephalic and atraumatic.     Right Ear: External ear normal.     Left Ear: External ear normal.     Nose: Nose normal.     Mouth/Throat:      Pharynx: No oropharyngeal exudate.  Eyes:     Conjunctiva/sclera: Conjunctivae normal.     Pupils: Pupils are equal, round, and reactive to light.  Neck:     Musculoskeletal: Normal range of motion and neck supple.  Cardiovascular:     Rate and Rhythm: Normal rate and regular rhythm.     Heart sounds: Normal heart sounds.  Pulmonary:     Effort: Pulmonary effort is normal. No tachypnea or respiratory distress.     Breath sounds: No stridor. No decreased breath sounds, wheezing, rhonchi or rales.  Chest:     Chest wall: No tenderness.  Abdominal:     Palpations: Abdomen is soft.     Tenderness: There is no abdominal tenderness. There is no guarding or rebound.  Musculoskeletal:     Right lower leg: He exhibits no tenderness. No edema.     Left lower leg: He exhibits no tenderness. No edema.  Skin:    General: Skin is warm.     Capillary Refill: Capillary refill takes less than 2 seconds.     Findings: No erythema or rash.  Neurological:     General: No focal deficit present.     Mental Status: He is alert and oriented to person, place, and time.     Cranial Nerves: No cranial nerve deficit.     Motor: No weakness or abnormal muscle tone.     Coordination: Coordination normal.     Deep Tendon Reflexes: Reflexes normal.  Psychiatric:        Mood and Affect: Mood normal.      ED Treatments / Results  Labs (all labs ordered are listed, but only abnormal results are displayed) Labs Reviewed  BASIC METABOLIC PANEL - Abnormal; Notable for the following components:      Result Value   CO2 20 (*)    All other components within normal limits  CBC - Abnormal; Notable for the following components:   WBC 12.3 (*)    All other components within normal limits  LIPID PANEL - Abnormal; Notable for the following components:   Cholesterol 219 (*)    LDL Cholesterol 150 (*)    All other components within normal limits  RESPIRATORY PANEL BY RT PCR (FLU A&B, COVID)  HEMOGLOBIN A1C  HIV  ANTIBODY (ROUTINE TESTING W REFLEX)  TSH  POC SARS CORONAVIRUS 2 AG -  ED  POC SARS CORONAVIRUS 2 AG  POCT I-STAT, CHEM 8  TROPONIN I (HIGH SENSITIVITY)  TROPONIN I (HIGH SENSITIVITY)  TROPONIN I (HIGH SENSITIVITY)    EKG EKG Interpretation  Date/Time:  Tuesday March 12 2019 12:58:38 EST Ventricular Rate:  96 PR Interval:  140 QRS Duration: 86 QT Interval:  332 QTC Calculation: 419 R Axis:   73 Text Interpretation: Critical Test Result: STEMI Normal sinus rhythm  Minimal voltage criteria for LVH, may be normal variant ( Sokolow-Lyon ) ST elevation consider anterolateral injury or acute infarct ST elevation consider inferior injury or acute infarct ** ** ACUTE MI / STEMI ** ** Abnormal ECG no prior ECG for comparison. ** ** ACUTE MI / STEMI ** ** Confirmed by Theda Belfast ([id number]) on 03/12/2019 1:23:39 PM   Radiology Dg Chest 2 View  Result Date: 03/12/2019 CLINICAL DATA:  Chest pain and shortness of breath. EXAM: CHEST - 2 VIEW COMPARISON:  None. FINDINGS: The heart size and mediastinal contours are within normal limits. Both lungs are clear. The visualized skeletal structures are unremarkable. IMPRESSION: No active cardiopulmonary disease. Electronically Signed   By: Gerome Sam III M.D   On: 03/12/2019 13:34    Procedures Procedures (including critical care time)  CRITICAL CARE Performed by: Canary Brim Tegeler Total critical care time: 35 minutes Critical care time was exclusive of separately billable procedures and treating other patients. Critical care was necessary to treat or prevent imminent or life-threatening deterioration. Critical care was time spent personally by me on the following activities: development of treatment plan with patient and/or surrogate as well as nursing, discussions with consultants, evaluation of patient's response to treatment, examination of patient, obtaining history from patient or surrogate, ordering and performing treatments and  interventions, ordering and review of laboratory studies, ordering and review of radiographic studies, pulse oximetry and re-evaluation of patient's condition.   Medications Ordered in ED Medications  acetaminophen (TYLENOL) tablet 650 mg (has no administration in time range)  ondansetron (ZOFRAN) injection 4 mg (has no administration in time range)  0.9% sodium chloride infusion (1 mL/kg/hr  75.8 kg Intravenous Restarted 03/12/19 1636)  sodium chloride flush (NS) 0.9 % injection 3 mL (has no administration in time range)  sodium chloride flush (NS) 0.9 % injection 3 mL (has no administration in time range)  0.9 %  sodium chloride infusion (has no administration in time range)  aspirin EC tablet 81 mg (has no administration in time range)  nitroGLYCERIN (NITROSTAT) SL tablet 0.4 mg (has no administration in time range)  heparin injection 5,000 Units (has no administration in time range)  colchicine tablet 0.6 mg (0.6 mg Oral Given 03/12/19 1546)  ibuprofen (ADVIL) tablet 800 mg (800 mg Oral Given 03/12/19 1637)  pantoprazole (PROTONIX) EC tablet 40 mg (has no administration in time range)  nicotine (NICODERM CQ - dosed in mg/24 hours) patch 21 mg (21 mg Transdermal Patch Applied 03/12/19 1637)  aspirin chewable tablet 324 mg (324 mg Oral Given 03/12/19 1335)  heparin injection 4,000 Units (4,000 Units Intravenous Given 03/12/19 1335)  nitroGLYCERIN 100 mcg/mL intra-arterial injection (has no administration in time range)  lidocaine (PF) (XYLOCAINE) 1 % injection (has no administration in time range)  Heparin (Porcine) in NaCl 1000-0.9 UT/500ML-% SOLN (has no administration in time range)  verapamil (ISOPTIN) 2.5 MG/ML injection (has no administration in time range)  Heparin (Porcine) in NaCl 1000-0.9 UT/500ML-% SOLN (has no administration in time range)     Initial Impression / Assessment and Plan / ED Course  I have reviewed the triage vital signs and the nursing notes.  Pertinent labs &  imaging results that were available during my care of the patient were reviewed by me and considered in my medical decision making (see chart for details).        Elijah Lester is a 53 y.o. male with a past medical history significant for right hip surgery several years ago who presents  with chest pain.  Patient reports that last night around 11:30 PM, he started having left-sided chest pain.  It is a pressure and it radiates into his left arm.  He reports associated diaphoresis, nausea, vomiting, shortness of breath.  He has never had this before.  He does report that " everyone in his family" have had heart attacks and his mother had a bypass in her early 44s.  He says that he took a feeling members nitro this morning which helped his pain go from a greater than 10 out of 10 to now minuscule.  He denies recent leg pain or leg swelling.  No history of DVT or PE.  No cough, congestion, or abdominal symptoms.  He reports his shortness of breath has improved.  He is concerned about his chest pain.  On exam, lungs are clear and chest is nontender.  Abdomen is nontender.  Good pulses in both upper extremities.  Good pulses lower extremities with no significant edema seen.  Vital signs reassuring on arrival.  Patient has normal gait.  EKG concerning for STEMI.  Code STEMI was activated.  Spoke with Dr. Martinique with the cardiology team in the Cath Lab will take him directly to the Cath Lab.  They requested Covid swab which was ordered, aspirin was ordered, and they requested a heparin bolus which the pharmacy team will put in.  Patient will be taken to Cath Lab for further cardiac evaluation for his chest pain.     Final Clinical Impressions(s) / ED Diagnoses   Final diagnoses:  ST elevation myocardial infarction (STEMI), unspecified artery (HCC)  Precordial pain     Clinical Impression: 1. ST elevation myocardial infarction (STEMI), unspecified artery ([place])   2. Precordial pain      Disposition: Admit  This note was prepared with assistance of Dragon voice recognition software. Occasional wrong-word or sound-a-like substitutions may have occurred due to the inherent limitations of voice recognition software.     Tegeler, Gwenyth Allegra, MD 03/12/19 [phone]

## 2019-03-12 NOTE — ED Notes (Signed)
[facility]/code stemi activate called at 1321 -ac

## 2019-03-12 NOTE — H&P (Signed)
Cardiology Admission History and Physical:   Patient ID: Elijah Lester MRN: [id number]; DOB: 1965-05-16   Admission date: 03/12/2019  Primary Care Provider: System, Pcp Not In Primary Cardiologist: Peter SwazilandJordan, MD  Primary Electrophysiologist:  None   Chief Complaint:  Chest pain, inferior and anterolateral ST elevation  Patient Profile:   Elijah Lester is a 53 y.o. male with no prior cardiac history who presented via EMS with crushing chest pain found to have ST elevation inferior and anterolateral leads.  History of Present Illness:   Elijah Lester is a current smoker who has no prior cardiac history. He has a strong family history of CAD - mother and father both had CABG in their 3550s. He reports onset of crushing chest pain last night at approximately 11pm. He took one of his wife's nitroglycerin tablets which eased the pain and he went to sleep and slept until 11 am today. He still had the chest pain with radiation to his left elbow associated with SOB. CP was worse when he went down stairs and is worse when he is supine. His wife brought him to ER. EKG with ST elevation inferior and anterolateral leads. CODE STEMI was activated and he was emergently brought to the cath lab for angiography.    Heart Pathway Score:     History reviewed. No pertinent past medical history.  Past Surgical History:  Procedure Laterality Date  . HIP SURGERY Right 2015     Medications Prior to Admission: Prior to Admission medications   Medication Sig Start Date End Date Taking? Authorizing Provider  HYDROcodone-acetaminophen (NORCO/VICODIN) 5-325 MG tablet Take 1-2 tablets by mouth every 4 (four) hours as needed for severe pain. 12/26/16   Joy, Shawn C, PA-C  ibuprofen (ADVIL,MOTRIN) 600 MG tablet Take 1 tablet (600 mg total) by mouth every 6 (six) hours as needed. 12/26/16   Joy, Shawn C, PA-C  ibuprofen (ADVIL,MOTRIN) 800 MG tablet Take 800 mg by mouth every 8 (eight) hours as needed (for hip pain).      [provider]  lidocaine (LIDODERM) 5 % Place 1 patch onto the skin daily. Remove & Discard patch within 12 hours or as directed by MD 12/26/16   Joy, Shawn C, PA-C  methocarbamol (ROBAXIN) 500 MG tablet Take 1 tablet (500 mg total) by mouth 2 (two) times daily. 12/26/16   Joy, Shawn C, PA-C  naproxen sodium (ANAPROX) 220 MG tablet Take 220-440 mg by mouth 2 (two) times daily as needed (for hip pain).     [provider]     Allergies:   No Known Allergies  Social History:   Social History   Socioeconomic History  . Marital status: Single    Spouse name: Not on file  . Number of children: Not on file  . Years of education: Not on file  . Highest education level: Not on file  Occupational History  . Not on file  Social Needs  . Financial resource strain: Not on file  . Food insecurity    Worry: Not on file    Inability: Not on file  . Transportation needs    Medical: Not on file    Non-medical: Not on file  Tobacco Use  . Smoking status: Current Every Day Smoker    Types: Cigarettes  . Smokeless tobacco: Never Used  Substance and Sexual Activity  . Alcohol use: No  . Drug use: No  . Sexual activity: Not on file  Lifestyle  . Physical activity  Days per week: Not on file    Minutes per session: Not on file  . Stress: Not on file  Relationships  . Social Musician on phone: Not on file    Gets together: Not on file    Attends religious service: Not on file    Active member of club or organization: Not on file    Attends meetings of clubs or organizations: Not on file    Relationship status: Not on file  . Intimate partner violence    Fear of current or ex partner: Not on file    Emotionally abused: Not on file    Physically abused: Not on file    Forced sexual activity: Not on file  Other Topics Concern  . Not on file  Social History Narrative  . Not on file    Family History:   The patient's family history includes CAD in his  father.    ROS:  Please see the history of present illness.  All other ROS reviewed and negative.     Physical Exam/Data:   Vitals:   03/12/19 1257 03/12/19 1258  BP: 128/76   Pulse: 94   Resp: 18   Temp: 98.1 F (36.7 C)   SpO2: 98%   Weight:  75.8 kg  Height:  5\' 11"  (1.803 m)   No intake or output data in the 24 hours ending 03/12/19 1338 Last 3 Weights 03/12/2019 12/26/2016  Weight (lbs) 167 lb 150 lb  Weight (kg) 75.751 kg 68.04 kg     Body mass index is 23.29 kg/m.  General:  Well nourished, well developed, in no acute distress HEENT: normal Neck: no JVD Vascular: No carotid bruits Cardiac:  normal S1, S2; RRR; no murmur  Lungs:  clear to auscultation bilaterally, no wheezing, rhonchi or rales  Abd: soft, nontender, no hepatomegaly  Ext: no edema Musculoskeletal:  No deformities, BUE and BLE strength normal and equal Skin: warm and dry  Neuro:  CNs 2-12 intact, no focal abnormalities noted Psych:  Normal affect    EKG:  The ECG that was done was personally reviewed and demonstrates sinus rhythm with HR 96 and ST elevation inferior leads and V2-6  Relevant CV Studies:  none  Laboratory Data:  High Sensitivity Troponin:  No results for input(s): TROPONINIHS in the last 720 hours.    ChemistryNo results for input(s): NA, K, CL, CO2, GLUCOSE, BUN, CREATININE, CALCIUM, GFRNONAA, GFRAA, ANIONGAP in the last 168 hours.  No results for input(s): PROT, ALBUMIN, AST, ALT, ALKPHOS, BILITOT in the last 168 hours. HematologyNo results for input(s): WBC, RBC, HGB, HCT, MCV, MCH, MCHC, RDW, PLT in the last 168 hours. BNPNo results for input(s): BNP, PROBNP in the last 168 hours.  DDimer No results for input(s): DDIMER in the last 168 hours.   Radiology/Studies:  Dg Chest 2 View  Result Date: 03/12/2019 CLINICAL DATA:  Chest pain and shortness of breath. EXAM: CHEST - 2 VIEW COMPARISON:  None. FINDINGS: The heart size and mediastinal contours are within normal limits.  Both lungs are clear. The visualized skeletal structures are unremarkable. IMPRESSION: No active cardiopulmonary disease. Electronically Signed   By: 14/11/2018 III M.D   On: 03/12/2019 13:34    Assessment and Plan:   ST elevation in inferior and anterolateral leads Suspect Pericarditis He was given ASA and 4000 U heparin. Patient was emergently brought to the cath lab for angiography. Left heart cath revealed mild nonobstructive disease. No intervention. Given  CP worse when supine, will empirically treat for pericarditis. He denies recent illness and no sick contacts.   Collect CRP, sed rate, TSH Obtain echocardiogram Will cycle hs troponin Will start colchicine and ibuprofen No heparin drip  Given family history, will obtain lipid profile and A1c for risk stratification.   Current everyday smoker Will be important for him to stop smoking.    Severity of Illness: The appropriate patient status for this patient is INPATIENT. Inpatient status is judged to be reasonable and necessary in order to provide the required intensity of service to ensure the patient's safety. The patient's presenting symptoms, physical exam findings, and initial radiographic and laboratory data in the context of their chronic comorbidities is felt to place them at high risk for further clinical deterioration. Furthermore, it is not anticipated that the patient will be medically stable for discharge from the hospital within 2 midnights of admission. The following factors support the patient status of inpatient.   " The patient's presenting symptoms include chest pain. " The worrisome physical exam findings include STEMI. " The initial radiographic and laboratory data are worrisome because of STEMI. " The chronic co-morbidities include smoking.   * I certify that at the point of admission it is my clinical judgment that the patient will require inpatient hospital care spanning beyond 2 midnights from the  point of admission due to high intensity of service, high risk for further deterioration and high frequency of surveillance required.*    For questions or updates, please contact [place] Please consult [email or web address] for contact info under        Signed, Ledora Bottcher, PA  03/12/2019 1:38 PM

## 2019-03-13 ENCOUNTER — Encounter (HOSPITAL_COMMUNITY): Payer: Self-pay | Admitting: Cardiology

## 2019-03-13 ENCOUNTER — Other Ambulatory Visit (HOSPITAL_COMMUNITY): Payer: Self-pay

## 2019-03-13 MED FILL — Nitroglycerin IV Soln 100 MCG/ML in D5W: INTRA_ARTERIAL | Qty: 10 | Status: AC

## 2019-03-15 ENCOUNTER — Other Ambulatory Visit: Payer: Self-pay | Admitting: Physician Assistant

## 2019-03-15 ENCOUNTER — Telehealth: Payer: Self-pay | Admitting: Cardiology

## 2019-03-15 DIAGNOSIS — I309 Acute pericarditis, unspecified: Secondary | ICD-10-CM

## 2019-03-15 NOTE — Telephone Encounter (Signed)
    Left message vcml to schedule urgent echo

## 2019-03-15 NOTE — Telephone Encounter (Signed)
Left message for patient to call and schedule Echo ( needs asap) and post hospital visit

## 2019-03-15 NOTE — Telephone Encounter (Signed)
-----   Message from Therisa Doyne sent at 03/15/2019  8:10 AM EST ----- Regarding: Echo Per Rosaria Ferries, PA--  Please call this pt to schedule for an echo and then soon follow-up with Dr. Martinique or APP after. Echo needs to be done as soon as possible for Pericarditis. Pt just had heart cath with Dr. Martinique on 12/8 for STEMI.   Order has been placed.  Thanks, Lovena Le, CMA

## 2019-03-18 NOTE — Telephone Encounter (Signed)
Left message for patient to call and schedule Echo and follow up with an APP

## 2019-03-25 ENCOUNTER — Encounter: Payer: Self-pay | Admitting: *Deleted

## 2019-03-25 NOTE — Telephone Encounter (Signed)
Left message for patient to call and schedule Echo and post hospital visit .  Will also mail letter to patient requesting he call the office.

## 2020-10-29 ENCOUNTER — Other Ambulatory Visit: Payer: Self-pay

## 2020-10-29 ENCOUNTER — Emergency Department (HOSPITAL_COMMUNITY)
Admission: EM | Admit: 2020-10-29 | Discharge: 2020-10-30 | Disposition: A | Discharge status: Home / Self Care | Payer: Self-pay | Attending: Student | Admitting: Student

## 2020-10-29 ENCOUNTER — Emergency Department (HOSPITAL_COMMUNITY): Payer: Self-pay

## 2020-10-29 DIAGNOSIS — M25551 Pain in right hip: Secondary | ICD-10-CM | POA: Insufficient documentation

## 2020-10-29 DIAGNOSIS — Y9234 Swimming pool (public) as the place of occurrence of the external cause: Secondary | ICD-10-CM | POA: Insufficient documentation

## 2020-10-29 DIAGNOSIS — Z5321 Procedure and treatment not carried out due to patient leaving prior to being seen by health care provider: Secondary | ICD-10-CM | POA: Insufficient documentation

## 2020-10-29 DIAGNOSIS — W1830XA Fall on same level, unspecified, initial encounter: Secondary | ICD-10-CM | POA: Insufficient documentation

## 2020-10-29 NOTE — ED Provider Notes (Signed)
Emergency Medicine Provider Triage Evaluation Note  Elijah Lester , a 55 y.o. male  was evaluated in triage.  Pt complains of right hip pain, started 4 days ago, he was at his pool fell onto his right hip, he denies hitting his head, losing conscious, is not on anticoagulant.  He denies any neck pain, back pain, chest pain, abdominal pain.  States he just has right hip pain, states it is worsened when he ambulates, states he can walk approximately 15-20 steps until he has to stop due to pain.  Will have occasional paresthesias in his right leg.  Currently has none at this time.  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  Concern for possible injury..  Review of Systems  Positive: Right hip pain Negative: Chest pain, back pain  Physical Exam  BP (!) 147/77 (BP Location: Right Arm)   Pulse 68   Temp 97.7 F (36.5 C) (Oral)   Resp (!) 22   Ht 5\' 11"  (1.803 m)   Wt 74.8 kg   SpO2 96%   BMI 23.01 kg/m  Gen:   Awake, no distress   Resp:  Normal effort  MSK:   Moves extremities without difficulty  Other:  No leg shortening, no internal or external rotation present.  Medical Decision Making  Medically screening exam initiated at 8:39 PM.  Appropriate orders placed.  Ethin C Hollingworth was informed that the remainder of the evaluation will be completed by another provider, this initial triage assessment does not replace that evaluation, and the importance of remaining in the ED until their evaluation is complete.  Presents with right hip pain patient need further work-up.   , PA-C 10/29/20 2040    10/31/20, MD 10/30/20 [phone]

## 2020-10-29 NOTE — ED Triage Notes (Signed)
Pt reported to ED with c/o right sided hip pain x4 days after fall at swimming pool. Pt reports concern for possible trauma to hip replacement. State he ha increased pain with movement and his ability to walk has been significantly impacted.

## 2020-10-30 NOTE — ED Notes (Signed)
Pt left. Pt stated he didn't want to wait any longer and that his ride was already on the way to get him.

## 2020-11-03 ENCOUNTER — Emergency Department (HOSPITAL_COMMUNITY)
Admission: EM | Admit: 2020-11-03 | Discharge: 2020-11-03 | Disposition: A | Discharge status: Home / Self Care | Payer: Self-pay | Attending: Physician Assistant | Admitting: Physician Assistant

## 2020-11-03 ENCOUNTER — Other Ambulatory Visit: Payer: Self-pay

## 2020-11-03 DIAGNOSIS — M25551 Pain in right hip: Secondary | ICD-10-CM | POA: Insufficient documentation

## 2020-11-03 DIAGNOSIS — M549 Dorsalgia, unspecified: Secondary | ICD-10-CM | POA: Insufficient documentation

## 2020-11-03 DIAGNOSIS — F17210 Nicotine dependence, cigarettes, uncomplicated: Secondary | ICD-10-CM | POA: Insufficient documentation

## 2020-11-03 MED ORDER — PREDNISONE 10 MG (21) PO TBPK: ORAL_TABLET | Freq: Every day | ORAL | 0 refills | Status: AC

## 2020-11-03 NOTE — Discharge Instructions (Addendum)
You may alternate taking Tylenol and Naproxen as needed for pain control. You may take Naproxen twice daily as directed on your discharge paperwork and you may take  [phone] mg of Tylenol every 6 hours. Do not exceed 4000 mg of Tylenol daily as this can lead to liver damage. Also, make sure to take Naproxen with meals as it can cause an upset stomach. Do not take other NSAIDs while taking Naproxen such as (Aleve, Ibuprofen, Aspirin, Celebrex, etc) and do not take more than the prescribed dose as this can lead to ulcers and bleeding in your GI tract. You may use warm and cold compresses to help with your symptoms.   Take prednisone as directed  Please follow up with your primary doctor within the next 7-10 days for re-evaluation and further treatment of your symptoms.   Please return to the ER sooner if you have any new or worsening symptoms.
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
## 2020-11-03 NOTE — ED Triage Notes (Signed)
Pt back after leaving the other night before he was seen , still with c/o hip pain and wanting a note for work  for a restriction on lifting

## 2020-11-09 ENCOUNTER — Other Ambulatory Visit: Payer: Self-pay

## 2020-11-09 ENCOUNTER — Ambulatory Visit: Payer: Self-pay | Admitting: Physician Assistant

## 2020-11-09 VITALS — BP 140/75 | HR 77 | Temp 98.3°F | Resp 16 | Ht 71.0 in | Wt 155.0 lb

## 2020-11-09 DIAGNOSIS — R030 Elevated blood-pressure reading, without diagnosis of hypertension: Secondary | ICD-10-CM

## 2020-11-09 DIAGNOSIS — M5441 Lumbago with sciatica, right side: Secondary | ICD-10-CM

## 2020-11-09 DIAGNOSIS — Z720 Tobacco use: Secondary | ICD-10-CM

## 2020-11-09 NOTE — Patient Instructions (Signed)
Your blood pressure is slightly elevated today.  I do encourage you to check your blood pressure at home, keep a written log and have available for your office visits.  I encourage you to continue using your prednisone, you can also use the ibuprofen as needed, make sure you are drinking plenty of water, and continue doing the gentle stretching.  Please let us know if there is anything else we can do for you.  Roney Jaffe, PA-C Physician Assistant [facility] [email or web address]   Sciatica  Sciatica is pain, numbness, weakness, or tingling along the path of the sciatic nerve. The sciatic nerve starts in the lower back and runs down the back of each leg. The nerve controls the muscles in the lower leg and in the back of the knee. It also provides feeling (sensation) to the back of the thigh, the lower leg, and the sole of the foot. Sciatica is a symptom of another medical condition that pinches or puts pressure on thesciatic nerve. Sciatica most often only affects one side of the body. Sciatica usually goes away on its own or with treatment. In some cases, sciatica may come back (recur). What are the causes? This condition is caused by pressure on the sciatic nerve or pinching of the nerve. This may be the result of: A disk in between the bones of the spine bulging out too far (herniated disk). Age-related changes in the spinal disks. A pain disorder that affects a muscle in the buttock. Extra bone growth near the sciatic nerve. A break (fracture) of the pelvis. Pregnancy. Tumor. This is rare. What increases the risk? The following factors may make you more likely to develop this condition: Playing sports that place pressure or stress on the spine. Having poor strength and flexibility. A history of back injury or surgery. Sitting for [place] periods of time. Doing activities that involve repetitive bending or  lifting. Obesity. What are the signs or symptoms? Symptoms can vary from mild to very severe, and they may include: Any of these problems in the lower back, leg, hip, or buttock: Mild tingling, numbness, or dull aches. Burning sensations. Sharp pains. Numbness in the back of the calf or the sole of the foot. Leg weakness. Severe back pain that makes movement difficult. Symptoms may get worse when you cough, sneeze, or laugh, or when you sit orstand for [place] periods of time. How is this diagnosed? This condition may be diagnosed based on: Your symptoms and medical history. A physical exam. Blood tests. Imaging tests, such as: X-rays. MRI. CT scan. How is this treated? In many cases, this condition improves on its own without treatment. However, treatment may include: Reducing or modifying physical activity. Exercising and stretching. Icing and applying heat to the affected area. Medicines that help to: Relieve pain and swelling. Relax your muscles. Injections of medicines that help to relieve pain, irritation, and inflammation around the sciatic nerve (steroids). Surgery. Follow these instructions at home: Medicines Take over-the-counter and prescription medicines only as told by your health care provider. Ask your health care provider if the medicine prescribed to you: Requires you to avoid driving or using heavy machinery. Can cause constipation. You may need to take these actions to prevent or treat constipation: Drink enough fluid to keep your urine pale yellow. Take over-the-counter or prescription medicines. Eat foods that are high in fiber, such as beans, whole grains, and fresh fruits and vegetables. Limit foods that are high in fat and processed sugars, such  as fried or sweet foods. Managing pain     If directed, put ice on the affected area. Put ice in a plastic bag. Place a towel between your skin and the bag. Leave the ice on for 20 minutes, 2-3 times a  day. If directed, apply heat to the affected area. Use the heat source that your health care provider recommends, such as a moist heat pack or a heating pad. Place a towel between your skin and the heat source. Leave the heat on for 20-30 minutes. Remove the heat if your skin turns bright red. This is especially important if you are unable to feel pain, heat, or cold. You may have a greater risk of getting burned. Activity  Return to your normal activities as told by your health care provider. Ask your health care provider what activities are safe for you. Avoid activities that make your symptoms worse. Take brief periods of rest throughout the day. When you rest for longer periods, mix in some mild activity or stretching between periods of rest. This will help to prevent stiffness and pain. Avoid sitting for Spake periods of time without moving. Get up and move around at least one time each hour. Exercise and stretch regularly, as told by your health care provider. Do not lift anything that is heavier than 10 lb (4.5 kg) while you have symptoms of sciatica. When you do not have symptoms, you should still avoid heavy lifting, especially repetitive heavy lifting. When you lift objects, always use proper lifting technique, which includes: Bending your knees. Keeping the load close to your body. Avoiding twisting.  General instructions Maintain a healthy weight. Excess weight puts extra stress on your back. Wear supportive, comfortable shoes. Avoid wearing high heels. Avoid sleeping on a mattress that is too soft or too hard. A mattress that is firm enough to support your back when you sleep may help to reduce your pain. Keep all follow-up visits as told by your health care provider. This is important. Contact a health care provider if: You have pain that: Wakes you up when you are sleeping. Gets worse when you lie down. Is worse than you have experienced in the past. Lasts longer than 4  weeks. You have an unexplained weight loss. Get help right away if: You are not able to control when you urinate or have bowel movements (incontinence). You have: Weakness in your lower back, pelvis, buttocks, or legs that gets worse. Redness or swelling of your back. A burning sensation when you urinate. Summary Sciatica is pain, numbness, weakness, or tingling along the path of the sciatic nerve. This condition is caused by pressure on the sciatic nerve or pinching of the nerve. Sciatica can cause pain, numbness, or tingling in the lower back, legs, hips, and buttocks. Treatment often includes rest, exercise, medicines, and applying ice or heat. This information is not intended to replace advice given to you by your health care provider. Make sure you discuss any questions you have with your healthcare provider. Document Revised: 04/09/2018 Document Reviewed: 04/09/2018 Elsevier Patient Education  2022 [employer]  How to Take Your Blood Pressure Blood pressure is a measurement of how strongly your blood is pressing against the walls of your arteries. Arteries are blood vessels that carry blood from your heart throughout your body. Your health care provider takes your blood pressure at each office visit. You can also take your own blood pressure athome with a blood pressure monitor. You may need to take your  own blood pressure to: Confirm a diagnosis of high blood pressure (hypertension). Monitor your blood pressure over time. Make sure your blood pressure medicine is working. Supplies needed: Blood pressure monitor. Dining room chair to sit in. Table or desk. Small notebook and pencil or pen. How to prepare To get the most accurate reading, avoid the following for 30 minutes before you check your blood pressure: Drinking caffeine. Drinking alcohol. Eating. Smoking. Exercising. Five minutes before you check your blood pressure: Use the bathroom and urinate so that you have an  empty bladder. Sit quietly in a dining room chair. Do not sit in a soft couch or an armchair. Do not talk. How to take your blood pressure To check your blood pressure, follow the instructions in the manual that came with your blood pressure monitor. If you have a digital blood pressure monitor, the instructions may be as follows: Sit up straight in a chair. Place your feet on the floor. Do not cross your ankles or legs. Rest your left arm at the level of your heart on a table or desk or on the arm of a chair. Pull up your shirt sleeve. Wrap the blood pressure cuff around the upper part of your left arm, 1 inch (2.5 cm) above your elbow. It is best to wrap the cuff around bare skin. Fit the cuff snugly around your arm. You should be able to place only one finger between the cuff and your arm. Position the cord so that it rests in the bend of your elbow. Press the power button. Sit quietly while the cuff inflates and deflates. Read the digital reading on the monitor screen and write the numbers down (record them) in a notebook. Wait 2-3 minutes, then repeat the steps, starting at step 1. What does my blood pressure reading mean? A blood pressure reading consists of a higher number over a lower number. Ideally, your blood pressure should be below 120/80. The first ("top") number is called the systolic pressure. It is a measure of the pressure in your arteries as your heart beats. The second ("bottom") number is called the diastolic pressure. It is a measure of the pressure in your arteries as theheart relaxes. Blood pressure is classified into five stages. The following are the stages for adults who do not have a short-term serious illness or a chronic condition. Systolic pressure and diastolic pressure are measured in a unit called mm Hg (millimeters of mercury). Normal Systolic pressure: below 120. Diastolic pressure: below 80. Elevated Systolic pressure: 120-129. Diastolic pressure: below  80. Hypertension stage 1 Systolic pressure: 130-139. Diastolic pressure: 80-89. Hypertension stage 2 Systolic pressure: 140 or above. Diastolic pressure: 90 or above. You can have elevated blood pressure or hypertension even if only the systolicor only the diastolic number in your reading is higher than normal. Follow these instructions at home: Medicines Take over-the-counter and prescription medicines only as told by your health care provider. Tell your health care provider if you are having any side effects from blood pressure medicine. General instructions Check your blood pressure as often as recommended by your health care provider. Check your blood pressure at the same time every day. Take your monitor to the next appointment with your health care provider to make sure that: You are using it correctly. It provides accurate readings. Understand what your goal blood pressure numbers are. Keep all follow-up visits as told by your health care provider. This is important. General tips Your health care provider can suggest a  reliable monitor that will meet your needs. There are several types of home blood pressure monitors. Choose a monitor that has an arm cuff. Do not choose a monitor that measures your blood pressure from your wrist or finger. Choose a cuff that wraps snugly around your upper arm. You should be able to fit only one finger between your arm and the cuff. You can buy a blood pressure monitor at most drugstores or online. Where to find more information American Heart Association: www.heart.org Contact a health care provider if: Your blood pressure is consistently high. Your blood pressure is suddenly low. Get help right away if: Your systolic blood pressure is higher than 180. Your diastolic blood pressure is higher than 120. Summary Blood pressure is a measurement of how strongly your blood is pressing against the walls of your arteries. A blood pressure reading  consists of a higher number over a lower number. Ideally, your blood pressure should be below 120/80. Check your blood pressure at the same time every day. Avoid caffeine, alcohol, smoking, and exercise for 30 minutes prior to checking your blood pressure. These agents can affect the accuracy of the blood pressure reading. This information is not intended to replace advice given to you by your health care provider. Make sure you discuss any questions you have with your healthcare provider. Document Revised: 01/29/2020 Document Reviewed: 03/15/2019 Elsevier Patient Education  2022 [employer].
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
## 2020-11-09 NOTE — Progress Notes (Signed)
HFU- hip discomfort Still on prednisone and Ibuprofen

## 2020-12-02 ENCOUNTER — Other Ambulatory Visit: Payer: Self-pay

## 2020-12-03 ENCOUNTER — Encounter: Payer: Self-pay | Admitting: Family Medicine

## 2020-12-03 ENCOUNTER — Ambulatory Visit (INDEPENDENT_AMBULATORY_CARE_PROVIDER_SITE_OTHER): Payer: Self-pay | Admitting: Family Medicine

## 2020-12-03 ENCOUNTER — Other Ambulatory Visit: Payer: Self-pay

## 2020-12-03 VITALS — BP 117/71 | HR 63 | Temp 97.7°F | Resp 16 | Ht 70.0 in | Wt 152.6 lb

## 2020-12-03 DIAGNOSIS — G8929 Other chronic pain: Secondary | ICD-10-CM

## 2020-12-03 DIAGNOSIS — M5441 Lumbago with sciatica, right side: Secondary | ICD-10-CM

## 2020-12-03 DIAGNOSIS — Z7689 Persons encountering health services in other specified circumstances: Secondary | ICD-10-CM

## 2020-12-03 MED ORDER — TRAMADOL HCL 50 MG PO TABS: 50.0000 mg | ORAL_TABLET | Freq: Four times a day (QID) | ORAL | 0 refills | Status: AC

## 2020-12-03 NOTE — Progress Notes (Signed)
Patient is here to est care. Patient is having trouble sitting on his buttocks because of a sore area

## 2020-12-03 NOTE — Progress Notes (Signed)
New Patient Office Visit  Subjective:  Patient ID: Elijah Lester, male    DOB: Feb 20, 1966  Age: 55 y.o. MRN: [id number]  CC:  Chief Complaint  Patient presents with   Establish Care    HPI Daniel C Cline presents for to establish care.  He reports that he has been having issues with sciatica down his right leg.  He currently takes gabapentin ibuprofen and Tylenol as needed.  He also has been using topical preps.  He reports the symptoms persist.  He does report that occasional prednisone does improve symptoms.  Denies known trauma or injury.  History reviewed. No pertinent past medical history.  Past Surgical History:  Procedure Laterality Date   HIP SURGERY Right 2015   LEFT HEART CATH AND CORONARY ANGIOGRAPHY N/A 03/12/2019   Procedure: LEFT HEART CATH AND CORONARY ANGIOGRAPHY;  Surgeon: [place], Peter M, MD;  Location: [facility] INVASIVE CV LAB;  Service: Cardiovascular;  Laterality: N/A;    Family History  Problem Relation Age of Onset   CAD Father        CABG in his 48s    Social History   Socioeconomic History   Marital status: Single    Spouse name: Not on file   Number of children: Not on file   Years of education: Not on file   Highest education level: Not on file  Occupational History   Not on file  Tobacco Use   Smoking status: Every Day    Types: Cigarettes   Smokeless tobacco: Never  Vaping Use   Vaping Use: Never used  Substance and Sexual Activity   Alcohol use: No   Drug use: No   Sexual activity: Not on file  Other Topics Concern   Not on file  Social History Narrative   Not on file   Social Determinants of Health   Financial Resource Strain: Not on file  Food Insecurity: Not on file  Transportation Needs: Not on file  Physical Activity: Not on file  Stress: Not on file  Social Connections: Not on file  Intimate Partner Violence: Not on file    ROS Review of Systems  Musculoskeletal:  Positive for back pain.  Neurological:  Negative for  weakness.  All other systems reviewed and are negative.  Objective:   Today's Vitals: BP 117/71 (BP Location: Left Arm, Patient Position: Sitting, Cuff Size: Normal)   Pulse 63   Temp 97.7 F (36.5 C) (Oral)   Resp 16   Ht 5\' 10"  (1.778 m)   Wt 152 lb 9.6 oz (69.2 kg)   SpO2 93%   BMI 21.90 kg/m   Physical Exam Vitals and nursing note reviewed.  Constitutional:      General: He is not in acute distress. Cardiovascular:     Rate and Rhythm: Normal rate and regular rhythm.  Pulmonary:     Effort: Pulmonary effort is normal.     Breath sounds: Normal breath sounds.  Musculoskeletal:     Lumbar back: Normal.  Neurological:     General: No focal deficit present.     Mental Status: He is alert and oriented to person, place, and time.     Motor: No weakness.     Gait: Gait normal.    Assessment & Plan:   1. Chronic right-sided low back pain with right-sided sciatica Patient to continue with gabapentin 3 times a day.  Continue with Tylenol, topical preps and ibuprofen as needed.  Tramadol was prescribed.  2. Encounter to  establish care     Outpatient Encounter Medications as of 12/03/2020  Medication Sig   traMADol (ULTRAM) 50 MG tablet Take 1 tablet (50 mg total) by mouth 4 (four) times daily for 5 days.   ibuprofen (ADVIL) 400 MG tablet Take 2 tablets (800 mg total) by mouth every 8 (eight) hours.   predniSONE (STERAPRED UNI-PAK 21 TAB) 10 MG (21) TBPK tablet Take by mouth daily. Take 6 tabs by mouth daily  for 2 days, then 5 tabs for 2 days, then 4 tabs for 2 days, then 3 tabs for 2 days, 2 tabs for 2 days, then 1 tab by mouth daily for 2 days   No facility-administered encounter medications on file as of 12/03/2020.    Follow-up: Return in about 4 weeks (around 12/31/2020) for follow up.   Tommie Raymond, MD

## 2022-12-10 IMAGING — CR DG HIP (WITH OR WITHOUT PELVIS) 2-3V*R*
3 series · 3 of 3 positions shown · non-contrast
Comparison: October 15, 2009

CLINICAL DATA: Right hip pain.

EXAM:
DG HIP (WITH OR WITHOUT PELVIS) 2-3V RIGHT

[pelvis ap]
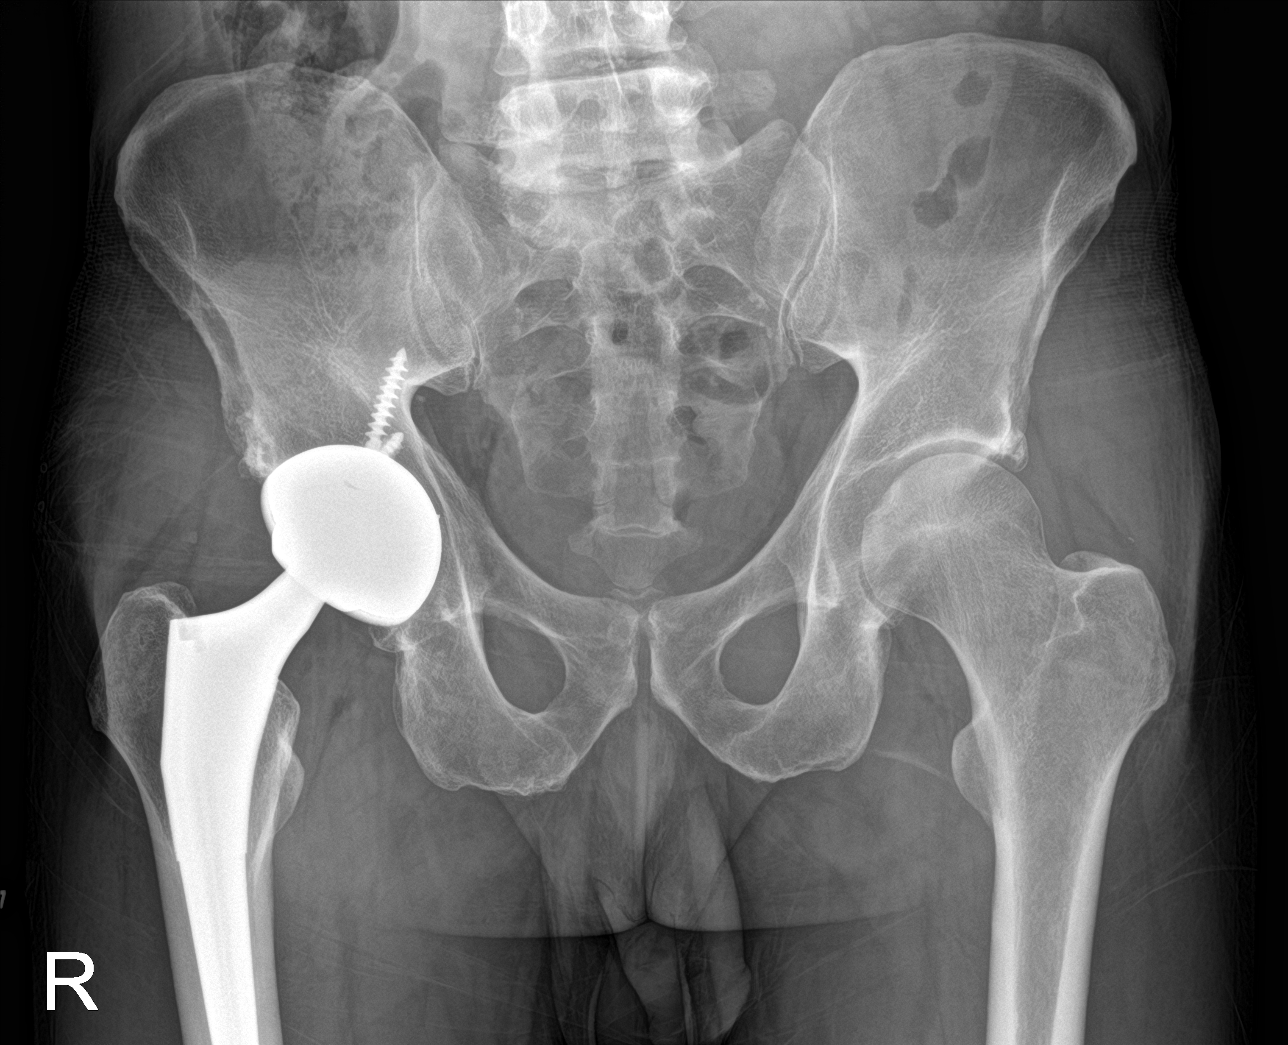

[hip ap]
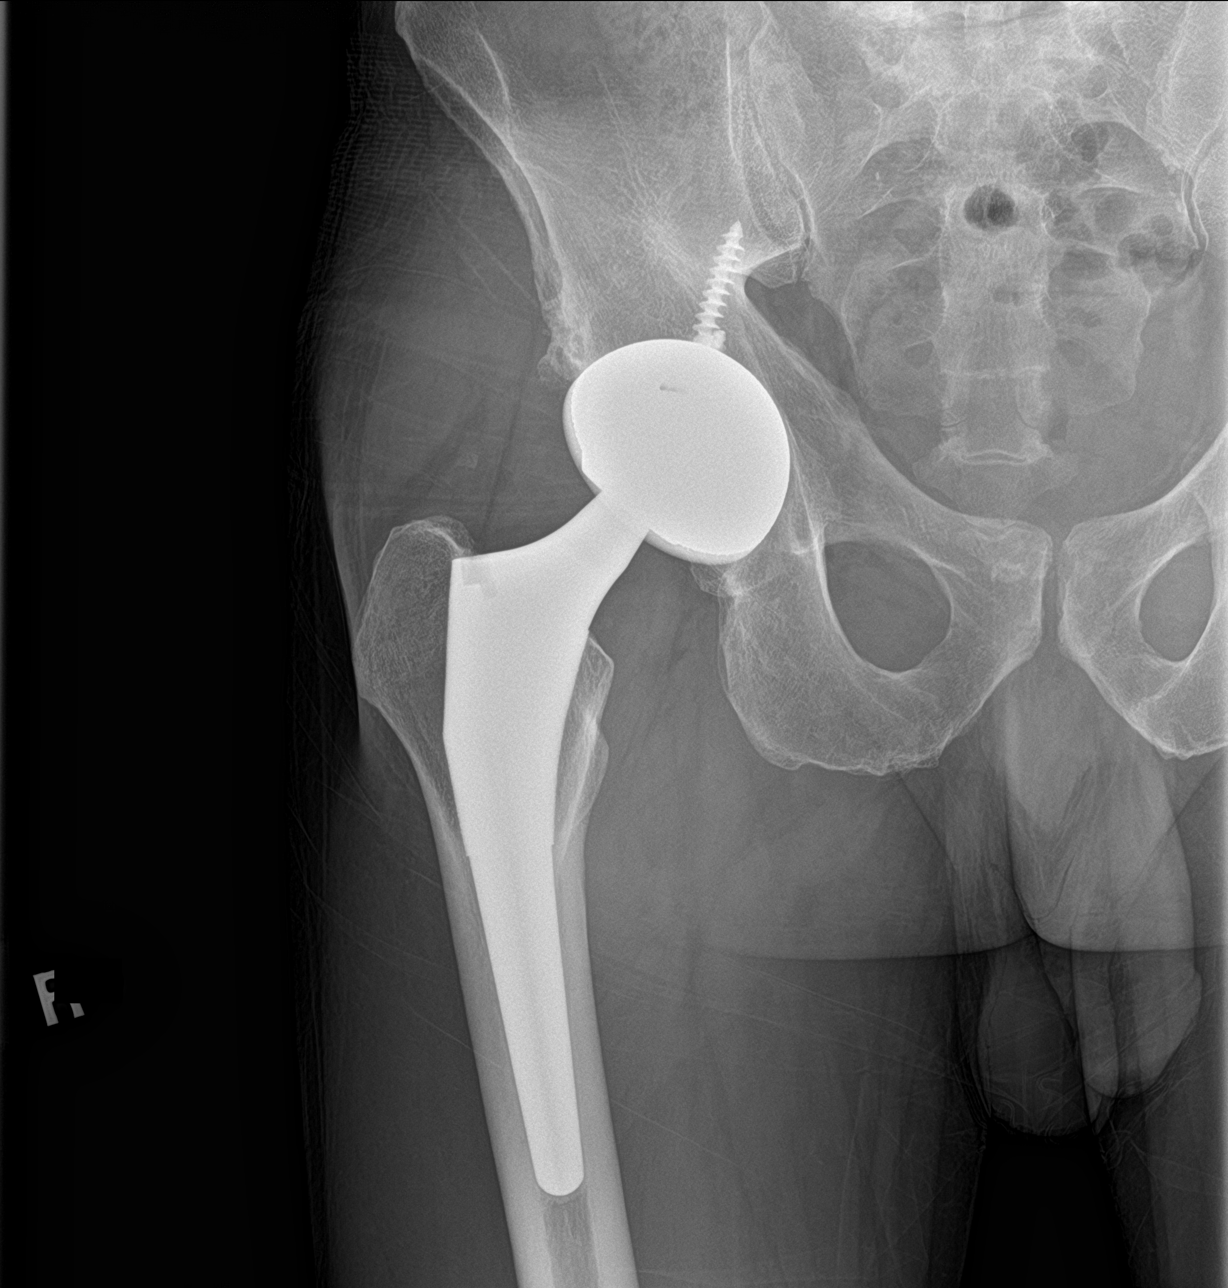

[hip lat]
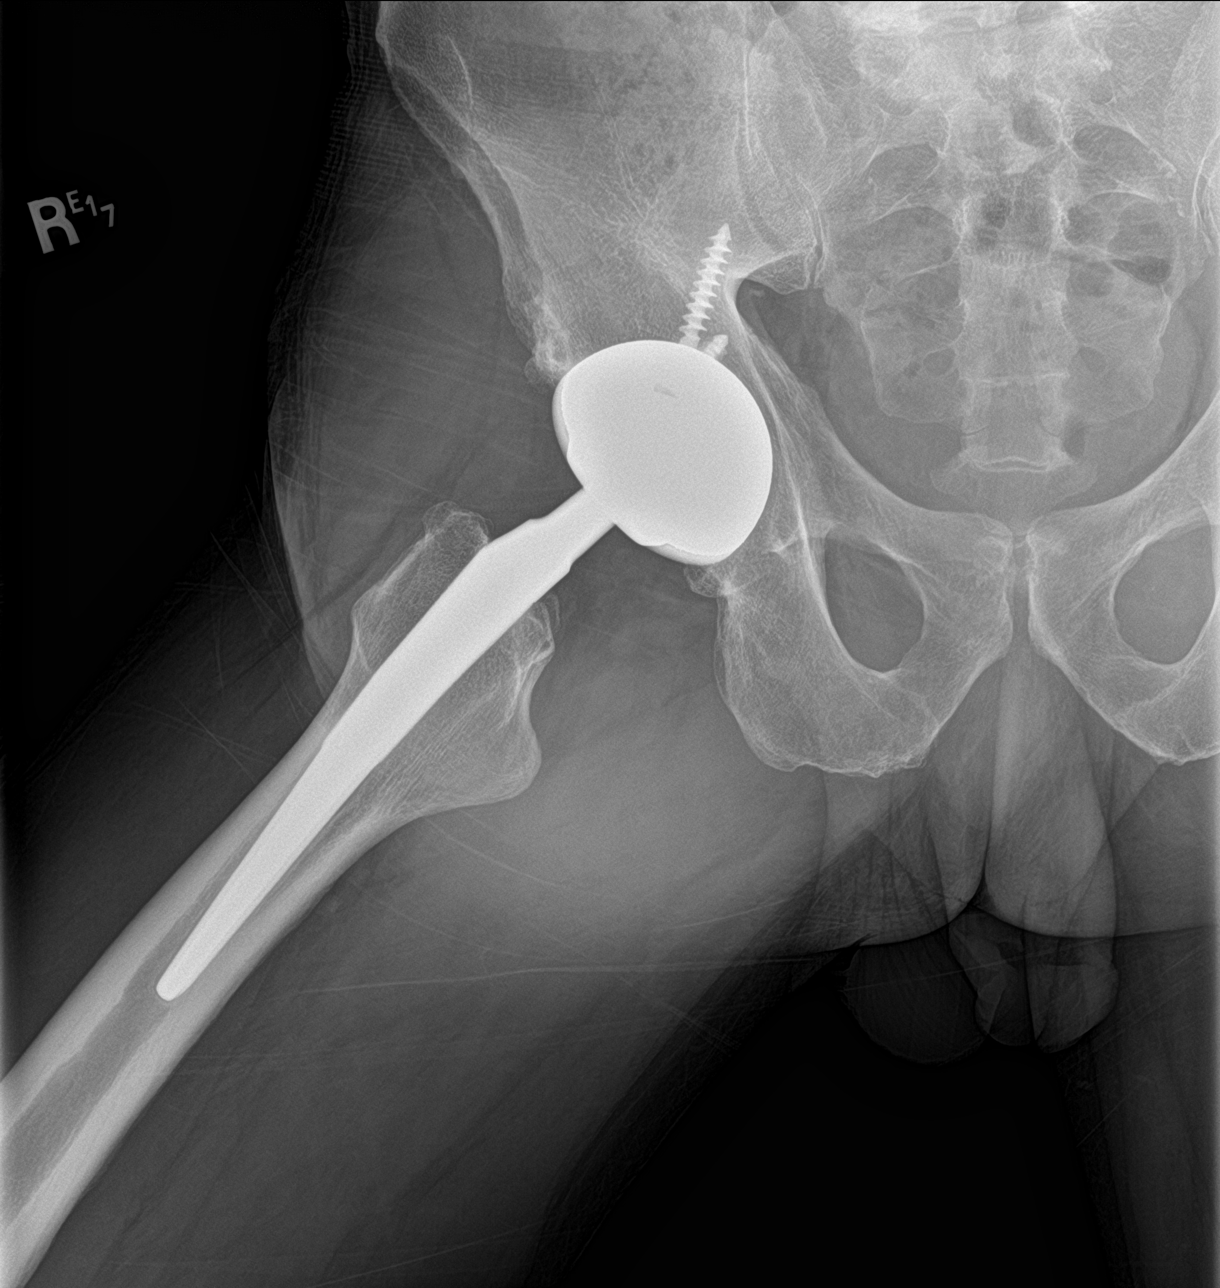

[3 of 3 positions shown; findings below may reference images not displayed]

FINDINGS: A total right hip replacement is seen. There is no evidence of
surrounding lucency to suggest the presence of hardware loosening or
infection. There is no evidence of hip fracture or dislocation. Soft
tissue structures are unremarkable.
IMPRESSION: 1. Intact right hip replacement.
2. No acute osseous abnormality.
